# Patient Record
Sex: Female | Born: 1960 | Race: White | Hispanic: No | Marital: Married | Smoking: Never smoker
Health system: Southern US, Community
[De-identification: ages and names within clinical notes are randomized; demographics above are authoritative.]

## PROBLEM LIST (undated history)

## (undated) DIAGNOSIS — R61 Generalized hyperhidrosis: Secondary | ICD-10-CM

## (undated) DIAGNOSIS — K219 Gastro-esophageal reflux disease without esophagitis: Secondary | ICD-10-CM

## (undated) DIAGNOSIS — M791 Myalgia, unspecified site: Secondary | ICD-10-CM

## (undated) DIAGNOSIS — Z87442 Personal history of urinary calculi: Secondary | ICD-10-CM

## (undated) DIAGNOSIS — M5030 Other cervical disc degeneration, unspecified cervical region: Secondary | ICD-10-CM

## (undated) HISTORY — PX: COLONOSCOPY: SHX174

## (undated) HISTORY — DX: Myalgia, unspecified site: M79.10

## (undated) HISTORY — DX: Generalized hyperhidrosis: R61

---

## 2000-12-12 HISTORY — PX: BACK SURGERY: SHX140

## 2004-04-30 ENCOUNTER — Emergency Department: Payer: Self-pay | Admitting: Emergency Medicine

## 2004-05-03 ENCOUNTER — Ambulatory Visit: Payer: Self-pay | Admitting: Urology

## 2004-06-03 HISTORY — PX: OTHER SURGICAL HISTORY: SHX169

## 2004-10-16 ENCOUNTER — Ambulatory Visit: Payer: Self-pay

## 2005-06-03 HISTORY — PX: ELBOW SURGERY: SHX618

## 2006-01-29 ENCOUNTER — Ambulatory Visit: Payer: Self-pay

## 2006-06-20 ENCOUNTER — Ambulatory Visit: Payer: Self-pay | Admitting: Unknown Physician Specialty

## 2006-07-29 ENCOUNTER — Encounter: Payer: Self-pay | Admitting: Unknown Physician Specialty

## 2006-08-02 ENCOUNTER — Encounter: Payer: Self-pay | Admitting: Unknown Physician Specialty

## 2007-02-11 ENCOUNTER — Ambulatory Visit: Payer: Self-pay

## 2008-05-03 ENCOUNTER — Ambulatory Visit: Payer: Self-pay

## 2009-05-04 ENCOUNTER — Ambulatory Visit: Payer: Self-pay

## 2010-03-17 ENCOUNTER — Inpatient Hospital Stay: Payer: Self-pay | Admitting: General Practice

## 2010-03-17 HISTORY — PX: ANKLE SURGERY: SHX546

## 2010-07-18 ENCOUNTER — Ambulatory Visit: Payer: Self-pay

## 2011-07-31 ENCOUNTER — Ambulatory Visit: Payer: Self-pay

## 2012-09-22 ENCOUNTER — Ambulatory Visit: Payer: Self-pay

## 2013-03-18 ENCOUNTER — Ambulatory Visit (INDEPENDENT_AMBULATORY_CARE_PROVIDER_SITE_OTHER): Payer: BC Managed Care – PPO | Admitting: Podiatry

## 2013-03-18 ENCOUNTER — Ambulatory Visit (INDEPENDENT_AMBULATORY_CARE_PROVIDER_SITE_OTHER): Payer: BC Managed Care – PPO

## 2013-03-18 ENCOUNTER — Encounter: Payer: Self-pay | Admitting: Podiatry

## 2013-03-18 ENCOUNTER — Encounter: Payer: Self-pay | Admitting: *Deleted

## 2013-03-18 VITALS — BP 139/82 | HR 79 | Resp 16 | Ht 69.0 in | Wt 245.0 lb

## 2013-03-18 DIAGNOSIS — M79609 Pain in unspecified limb: Secondary | ICD-10-CM

## 2013-03-18 DIAGNOSIS — M79671 Pain in right foot: Secondary | ICD-10-CM

## 2013-03-18 DIAGNOSIS — M722 Plantar fascial fibromatosis: Secondary | ICD-10-CM

## 2013-03-18 NOTE — Patient Instructions (Signed)
Plantar Fasciitis (Heel Spur Syndrome) with Rehab The plantar fascia is a fibrous, ligament-like, soft-tissue structure that spans the bottom of the foot. Plantar fasciitis is a condition that causes pain in the foot due to inflammation of the tissue. SYMPTOMS   Pain and tenderness on the underneath side of the foot.  Pain that worsens with standing or walking. CAUSES  Plantar fasciitis is caused by irritation and injury to the plantar fascia on the underneath side of the foot. Common mechanisms of injury include:  Direct trauma to bottom of the foot.  Damage to a small nerve that runs under the foot where the main fascia attaches to the heel bone.  Stress placed on the plantar fascia due to bone spurs. RISK INCREASES WITH:   Activities that place stress on the plantar fascia (running, jumping, pivoting, or cutting).  Poor strength and flexibility.  Improperly fitted shoes.  Tight calf muscles.  Flat feet.  Failure to warm-up properly before activity.  Obesity. PREVENTION  Warm up and stretch properly before activity.  Allow for adequate recovery between workouts.  Maintain physical fitness:  Strength, flexibility, and endurance.  Cardiovascular fitness.  Maintain a health body weight.  Avoid stress on the plantar fascia.  Wear properly fitted shoes, including arch supports for individuals who have flat feet. PROGNOSIS  If treated properly, then the symptoms of plantar fasciitis usually resolve without surgery. However, occasionally surgery is necessary. RELATED COMPLICATIONS   Recurrent symptoms that may result in a chronic condition.  Problems of the lower back that are caused by compensating for the injury, such as limping.  Pain or weakness of the foot during push-off following surgery.  Chronic inflammation, scarring, and partial or complete fascia tear, occurring more often from repeated injections. TREATMENT  Treatment initially involves the use of  ice and medication to help reduce pain and inflammation. The use of strengthening and stretching exercises may help reduce pain with activity, especially stretches of the Achilles tendon. These exercises may be performed at home or with a therapist. Your caregiver may recommend that you use heel cups of arch supports to help reduce stress on the plantar fascia. Occasionally, corticosteroid injections are given to reduce inflammation. If symptoms persist for greater than 6 months despite non-surgical (conservative), then surgery may be recommended.  MEDICATION   If pain medication is necessary, then nonsteroidal anti-inflammatory medications, such as aspirin and ibuprofen, or other minor pain relievers, such as acetaminophen, are often recommended.  Do not take pain medication within 7 days before surgery.  Prescription pain relievers may be given if deemed necessary by your caregiver. Use only as directed and only as much as you need.  Corticosteroid injections may be given by your caregiver. These injections should be reserved for the most serious cases, because they may only be given a certain number of times. HEAT AND COLD  Cold treatment (icing) relieves pain and reduces inflammation. Cold treatment should be applied for 10 to 15 minutes every 2 to 3 hours for inflammation and pain and immediately after any activity that aggravates your symptoms. Use ice packs or massage the area with a piece of ice (ice massage).  Heat treatment may be used prior to performing the stretching and strengthening activities prescribed by your caregiver, physical therapist, or athletic trainer. Use a heat pack or soak the injury in warm water. SEEK IMMEDIATE MEDICAL CARE IF:  Treatment seems to offer no benefit, or the condition worsens.  Any medications produce adverse side effects. EXERCISES RANGE   OF MOTION (ROM) AND STRETCHING EXERCISES - Plantar Fasciitis (Heel Spur Syndrome) These exercises may help you  when beginning to rehabilitate your injury. Your symptoms may resolve with or without further involvement from your physician, physical therapist or athletic trainer. While completing these exercises, remember:   Restoring tissue flexibility helps normal motion to return to the joints. This allows healthier, less painful movement and activity.  An effective stretch should be held for at least 30 seconds.  A stretch should never be painful. You should only feel a gentle lengthening or release in the stretched tissue. RANGE OF MOTION - Toe Extension, Flexion  Sit with your right / left leg crossed over your opposite knee.  Grasp your toes and gently pull them back toward the top of your foot. You should feel a stretch on the bottom of your toes and/or foot.  Hold this stretch for __________ seconds.  Now, gently pull your toes toward the bottom of your foot. You should feel a stretch on the top of your toes and or foot.  Hold this stretch for __________ seconds. Repeat __________ times. Complete this stretch __________ times per day.  RANGE OF MOTION - Ankle Dorsiflexion, Active Assisted  Remove shoes and sit on a chair that is preferably not on a carpeted surface.  Place right / left foot under knee. Extend your opposite leg for support.  Keeping your heel down, slide your right / left foot back toward the chair until you feel a stretch at your ankle or calf. If you do not feel a stretch, slide your bottom forward to the edge of the chair, while still keeping your heel down.  Hold this stretch for __________ seconds. Repeat __________ times. Complete this stretch __________ times per day.  STRETCH  Gastroc, Standing  Place hands on wall.  Extend right / left leg, keeping the front knee somewhat bent.  Slightly point your toes inward on your back foot.  Keeping your right / left heel on the floor and your knee straight, shift your weight toward the wall, not allowing your back to  arch.  You should feel a gentle stretch in the right / left calf. Hold this position for __________ seconds. Repeat __________ times. Complete this stretch __________ times per day. STRETCH  Soleus, Standing  Place hands on wall.  Extend right / left leg, keeping the other knee somewhat bent.  Slightly point your toes inward on your back foot.  Keep your right / left heel on the floor, bend your back knee, and slightly shift your weight over the back leg so that you feel a gentle stretch deep in your back calf.  Hold this position for __________ seconds. Repeat __________ times. Complete this stretch __________ times per day. STRETCH  Gastrocsoleus, Standing  Note: This exercise can place a lot of stress on your foot and ankle. Please complete this exercise only if specifically instructed by your caregiver.   Place the ball of your right / left foot on a step, keeping your other foot firmly on the same step.  Hold on to the wall or a rail for balance.  Slowly lift your other foot, allowing your body weight to press your heel down over the edge of the step.  You should feel a stretch in your right / left calf.  Hold this position for __________ seconds.  Repeat this exercise with a slight bend in your right / left knee. Repeat __________ times. Complete this stretch __________ times per day.    STRENGTHENING EXERCISES - Plantar Fasciitis (Heel Spur Syndrome)  These exercises may help you when beginning to rehabilitate your injury. They may resolve your symptoms with or without further involvement from your physician, physical therapist or athletic trainer. While completing these exercises, remember:   Muscles can gain both the endurance and the strength needed for everyday activities through controlled exercises.  Complete these exercises as instructed by your physician, physical therapist or athletic trainer. Progress the resistance and repetitions only as guided. STRENGTH - Towel  Curls  Sit in a chair positioned on a non-carpeted surface.  Place your foot on a towel, keeping your heel on the floor.  Pull the towel toward your heel by only curling your toes. Keep your heel on the floor.  If instructed by your physician, physical therapist or athletic trainer, add ____________________ at the end of the towel. Repeat __________ times. Complete this exercise __________ times per day. STRENGTH - Ankle Inversion  Secure one end of a rubber exercise band/tubing to a fixed object (table, pole). Loop the other end around your foot just before your toes.  Place your fists between your knees. This will focus your strengthening at your ankle.  Slowly, pull your big toe up and in, making sure the band/tubing is positioned to resist the entire motion.  Hold this position for __________ seconds.  Have your muscles resist the band/tubing as it slowly pulls your foot back to the starting position. Repeat __________ times. Complete this exercises __________ times per day.  Document Released: 05/20/2005 Document Revised: 08/12/2011 Document Reviewed: 09/01/2008 [employer] Patient Information 2014 [employer]. Plantar Fasciitis Plantar fasciitis is a common condition that causes foot pain. It is soreness (inflammation) of the band of tough fibrous tissue on the bottom of the foot that runs from the heel bone (calcaneus) to the ball of the foot. The cause of this soreness may be from excessive standing, poor fitting shoes, running on hard surfaces, being overweight, having an abnormal walk, or overuse (this is common in runners) of the painful foot or feet. It is also common in aerobic exercise dancers and ballet dancers. SYMPTOMS  Most people with plantar fasciitis complain of:  Severe pain in the morning on the bottom of their foot especially when taking the first steps out of bed. This pain recedes after a few minutes of walking.  Severe pain is experienced also during walking  following a long period of inactivity.  Pain is worse when walking barefoot or up stairs DIAGNOSIS   Your caregiver will diagnose this condition by examining and feeling your foot.  Special tests such as X-rays of your foot, are usually not needed. PREVENTION   Consult a sports medicine professional before beginning a new exercise program.  Walking programs offer a good workout. With walking there is a lower chance of overuse injuries common to runners. There is less impact and less jarring of the joints.  Begin all new exercise programs slowly. If problems or pain develop, decrease the amount of time or distance until you are at a comfortable level.  Wear good shoes and replace them regularly.  Stretch your foot and the heel cords at the back of the ankle (Achilles tendon) both before and after exercise.  Run or exercise on even surfaces that are not hard. For example, asphalt is better than pavement.  Do not run barefoot on hard surfaces.  If using a treadmill, vary the incline.  Do not continue to workout if you have foot or joint   problems. Seek professional help if they do not improve. HOME CARE INSTRUCTIONS   Avoid activities that cause you pain until you recover.  Use ice or cold packs on the problem or painful areas after working out.  Only take over-the-counter or prescription medicines for pain, discomfort, or fever as directed by your caregiver.  Soft shoe inserts or athletic shoes with air or gel sole cushions may be helpful.  If problems continue or become more severe, consult a sports medicine caregiver or your own health care provider. Cortisone is a potent anti-inflammatory medication that may be injected into the painful area. You can discuss this treatment with your caregiver. MAKE SURE YOU:   Understand these instructions.  Will watch your condition.  Will get help right away if you are not doing well or get worse. Document Released: 02/12/2001 Document  Revised: 08/12/2011 Document Reviewed: 04/13/2008 [employer] Patient Information 2014 [employer].  

## 2013-03-18 NOTE — Progress Notes (Signed)
Tammy Koch presents today with a chief complaint of right heel pain times last few weeks. She's tried to use her Cam Walker. She states that the tibialis anterior tendinitis has resolved. She stated that she was doing very well until a plantar fasciitis flared up.  Objective: Vital signs are stable she is alert and oriented x3 I reviewed her past medical history medications and allergies. Pulses remain palpable to the right lower extremity. No calf pain. Mild to moderate edema to the bilateral legs. She has pain on palpation medial calcaneal tubercle of the right heel. Radiographic evaluation does demonstrate soft tissue increase in density at the plantar fascial calcaneal insertion site of the right heel.  Assessment: Plantar fasciitis right.  Plan: Discussed etiology pathology conservative versus surgical therapies. We discussed appropriate shoe gear stretching exercises and ice therapy. Handouts were given on these. I injected the right heel with 20 mg of Kenalog and local anesthetic. Plantar fascial strapping was applied. Her Sterapred Dosepak to be followed by Mobic. We'll followup with her in one month.

## 2013-03-22 ENCOUNTER — Telehealth: Payer: Self-pay | Admitting: *Deleted

## 2013-03-22 ENCOUNTER — Other Ambulatory Visit: Payer: Self-pay | Admitting: Podiatry

## 2013-03-22 MED ORDER — METHYLPREDNISOLONE (PAK) 4 MG PO TABS: ORAL_TABLET | ORAL | Status: DC

## 2013-03-22 NOTE — Telephone Encounter (Signed)
I have prescribed it and it should be waiting on her at the pharmacy with her mobic.

## 2013-03-22 NOTE — Telephone Encounter (Signed)
Patient stated that she was here Thursday and saw Dr Al Corpus, she stated that you would be prescribing a prednisone for her .

## 2013-03-26 ENCOUNTER — Telehealth: Payer: Self-pay | Admitting: *Deleted

## 2013-03-26 NOTE — Telephone Encounter (Signed)
Message copied by Bing Ree on Fri Mar 26, 2013  1:57 PM ------      Message from: Kathreen Cornfield      Created: Fri Mar 26, 2013  1:43 PM      Regarding: PHONE CALL      Contact: [phone]       CALLED AND SAID SHE IS CURRENTLY TAKING PREDNISONE AND WOULD LIKE TO KNOW IF SHE IS STILL SUPPOSE TO TAKE THE MOBIC ------

## 2013-04-14 ENCOUNTER — Ambulatory Visit: Payer: BC Managed Care – PPO | Admitting: Podiatry

## 2013-05-12 ENCOUNTER — Ambulatory Visit: Payer: BC Managed Care – PPO | Admitting: Podiatry

## 2013-11-01 ENCOUNTER — Ambulatory Visit: Payer: Self-pay

## 2013-11-01 DIAGNOSIS — I10 Essential (primary) hypertension: Secondary | ICD-10-CM | POA: Insufficient documentation

## 2014-01-17 ENCOUNTER — Ambulatory Visit: Payer: Self-pay | Admitting: Gastroenterology

## 2014-01-19 LAB — PATHOLOGY REPORT

## 2014-04-01 DIAGNOSIS — E538 Deficiency of other specified B group vitamins: Secondary | ICD-10-CM | POA: Insufficient documentation

## 2014-11-14 ENCOUNTER — Ambulatory Visit (INDEPENDENT_AMBULATORY_CARE_PROVIDER_SITE_OTHER): Payer: BLUE CROSS/BLUE SHIELD | Admitting: Podiatry

## 2014-11-14 ENCOUNTER — Ambulatory Visit (INDEPENDENT_AMBULATORY_CARE_PROVIDER_SITE_OTHER): Payer: BLUE CROSS/BLUE SHIELD

## 2014-11-14 ENCOUNTER — Encounter: Payer: Self-pay | Admitting: Podiatry

## 2014-11-14 VITALS — BP 116/68 | HR 79 | Resp 16

## 2014-11-14 DIAGNOSIS — S99922A Unspecified injury of left foot, initial encounter: Secondary | ICD-10-CM

## 2014-11-14 DIAGNOSIS — S92302A Fracture of unspecified metatarsal bone(s), left foot, initial encounter for closed fracture: Secondary | ICD-10-CM

## 2014-11-14 NOTE — Progress Notes (Signed)
She presents in a chief complaint of the painful left foot since she twisted her weekend while she was wearing her flip-flops instead a stair wrong. No changes in her past medical history medications.  Objective: Pulses are palpable bilateral. Moderate edema left foot with ecchymosis overlying the lesser metatarsophalangeal joints and the fifth metatarsal of the left foot. Radiographs confirm a fracture the base of the fifth metatarsal left foot. Moderately displaced laterally minimally displaced dorsally  Assessment: Fracture fifth metatarsal base left.  Plan: Discussed etiology pathology conservative versus surgical therapies. Place her in a cam walker which she already has at home and will follow up with her in 3 weeks rate and be taken at that time. If not improved surgical intervention will be necessary.

## 2014-12-07 ENCOUNTER — Encounter: Payer: Self-pay | Admitting: Podiatry

## 2014-12-07 ENCOUNTER — Ambulatory Visit (INDEPENDENT_AMBULATORY_CARE_PROVIDER_SITE_OTHER): Payer: BLUE CROSS/BLUE SHIELD | Admitting: Podiatry

## 2014-12-07 ENCOUNTER — Ambulatory Visit (INDEPENDENT_AMBULATORY_CARE_PROVIDER_SITE_OTHER): Payer: BLUE CROSS/BLUE SHIELD

## 2014-12-07 ENCOUNTER — Ambulatory Visit
Admission: RE | Admit: 2014-12-07 | Discharge: 2014-12-07 | Disposition: A | Discharge status: Home / Self Care | Payer: BLUE CROSS/BLUE SHIELD | Source: Ambulatory Visit | Attending: Obstetrics and Gynecology | Admitting: Obstetrics and Gynecology

## 2014-12-07 ENCOUNTER — Other Ambulatory Visit: Payer: Self-pay | Admitting: Obstetrics and Gynecology

## 2014-12-07 VITALS — BP 124/79 | HR 99 | Resp 18

## 2014-12-07 DIAGNOSIS — Z1231 Encounter for screening mammogram for malignant neoplasm of breast: Secondary | ICD-10-CM

## 2014-12-07 DIAGNOSIS — S92302D Fracture of unspecified metatarsal bone(s), left foot, subsequent encounter for fracture with routine healing: Secondary | ICD-10-CM

## 2014-12-07 NOTE — Progress Notes (Signed)
She presents today for follow-up of her fifth metatarsal fracture left foot. She states that it feels much better. 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  Objective: Vital signs are stable she is alert and oriented 3. Pulses are palpable. She has mild Tinel's on palpation fifth metatarsal base of the left foot without ecchymosis and without edema. Radiographs do demonstrate diastases fifth metatarsal base fracture.  Assessment fifth metatarsal base fracture slowly healing left.  Plan: Continue the use of the Cam Walker anytime that she is not at work. Follow up with her for another set of x-rays in 4-6 weeks.

## 2015-01-18 ENCOUNTER — Ambulatory Visit: Payer: BLUE CROSS/BLUE SHIELD | Admitting: Podiatry

## 2015-01-23 ENCOUNTER — Other Ambulatory Visit
Admission: RE | Admit: 2015-01-23 | Discharge: 2015-01-23 | Disposition: A | Discharge status: Home / Self Care | Payer: BLUE CROSS/BLUE SHIELD | Source: Ambulatory Visit | Attending: Internal Medicine | Admitting: Internal Medicine

## 2015-01-23 DIAGNOSIS — R079 Chest pain, unspecified: Secondary | ICD-10-CM | POA: Insufficient documentation

## 2015-01-23 LAB — TROPONIN I

## 2015-02-19 ENCOUNTER — Encounter: Payer: Self-pay | Admitting: Emergency Medicine

## 2015-02-19 ENCOUNTER — Ambulatory Visit
Admission: EM | Admit: 2015-02-19 | Discharge: 2015-02-19 | Disposition: A | Discharge status: Home / Self Care | Payer: BLUE CROSS/BLUE SHIELD | Attending: Family Medicine | Admitting: Family Medicine

## 2015-02-19 DIAGNOSIS — B998 Other infectious disease: Secondary | ICD-10-CM | POA: Diagnosis not present

## 2015-02-19 DIAGNOSIS — L089 Local infection of the skin and subcutaneous tissue, unspecified: Secondary | ICD-10-CM

## 2015-02-19 MED ORDER — AMOXICILLIN-POT CLAVULANATE 875-125 MG PO TABS: 1.0000 | ORAL_TABLET | Freq: Two times a day (BID) | ORAL | Status: DC

## 2015-02-19 NOTE — ED Notes (Signed)
Patient presents here with c/o rt side swelling to the face , denies any fever, the swelling started as a small on 1 months ago and got worse

## 2015-02-19 NOTE — Discharge Instructions (Signed)
Facial Infection °You have an infection of your face. This requires special attention to help prevent serious problems. Infections in facial wounds can cause poor healing and scars. They can also spread to deeper tissues, especially around the eye. Wound and dental infections can lead to sinusitis, infection of the eye socket, and even meningitis. Permanent damage to the skin, eye, and nervous system may result if facial infections are not treated properly. With severe infections, hospital care for IV antibiotic injections may be needed if they don't respond to oral antibiotics. °Antibiotics must be taken for the full course to insure the infection is eliminated. If the infection came from a bad tooth, it may have to be extracted when the infection is under control. Warm compresses may be applied to reduce skin irritation and remove drainage. °You might need a tetanus shot now if: °· You cannot remember when your last tetanus shot was. °· You have never had a tetanus shot. °· The object that caused your wound was dirty. °If you need a tetanus shot, and you decide not to get one, there is a rare chance of getting tetanus. Sickness from tetanus can be serious. If you got a tetanus shot, your arm may swell, get red and warm to the touch at the shot site. This is common and not a problem. °SEEK IMMEDIATE MEDICAL CARE IF:  °· You have increased swelling, redness, or trouble breathing. °· You have a severe headache, dizziness, nausea, or vomiting. °· You develop problems with your eyesight. °· You have a fever. °Document Released: 06/27/2004 Document Revised: 08/12/2011 Document Reviewed: 05/20/2005 °Exit[employer]® Patient Information ©2015 [employer]. This information is not intended to replace advice given to you by your health care provider. Make sure you discuss any questions you have with your health care provider. ° °

## 2015-02-19 NOTE — ED Provider Notes (Signed)
CSN: [id number]     Arrival date & time 02/19/15  1122 History   First MD Initiated Contact with Patient 02/19/15 1343     Chief Complaint  Patient presents with  . Facial Swelling   (Consider location/radiation/quality/duration/timing/severity/associated sxs/prior Treatment) HPI   This a 54 year old female who presents today with a sudden onset of nose and facial swelling on the right side. She states that she noticed it last last night and awoke this morning with more swollen. SHe has tenderness in the area and has been having this large amount of sinus drainage that side since the swelling began. She also relates that one month ago she had a fever blister in that area after she returned from the beach and been in the sun a great deal of time. This went away after a period of time and she began to have some pain on the inner side of her right nare. Denies any fever or chills. Prior to the onset of the fever blister she's had a feeling of a knot on the side of her nose beside her nose on her cheek about the size of a pea  which now extends from her  nasal fold to over her lip. She has not had any dental problems;recently had a simple filling preceding this. Denies any facial weakness or difficulty closing her eye; loss of taste; today does have a flattening of the nasolabial fold from the swelling.    Past Medical History  Diagnosis Date  . Muscle pain   . Night sweats    Past Surgical History  Procedure Laterality Date  . Back surgery  7.12.2002  . Ankle surgery  10.15.2011  . Elbow surgery Right 2007  . Kidney stones  2006    LITHOTRIPSY   Family History  Problem Relation Age of Onset  . Ovarian cancer Mother 44   Social History  Substance Use Topics  . Smoking status: Never Smoker   . Smokeless tobacco: Never Used  . Alcohol Use: No   OB History    No data available     Review of Systems  Constitutional: Positive for fatigue. Negative for fever, chills and unexpected  weight change.  HENT: Positive for ear pain, facial swelling and mouth sores. Negative for hearing loss and sore throat.   Eyes: Negative for photophobia, pain, discharge, redness and visual disturbance.  Neurological: Positive for facial asymmetry and headaches.  All other systems reviewed and are negative.   Allergies  Morphine and Morphine and related  Home Medications   Prior to Admission medications   Medication Sig Start Date End Date Taking? Authorizing Provider  acetaminophen (TYLENOL) 500 MG tablet Take 500 mg by mouth every 6 (six) hours as needed.    Historical Provider, MD  amoxicillin-clavulanate (AUGMENTIN) 875-125 MG per tablet Take 1 tablet by mouth every 12 (twelve) hours. 02/19/15   Lorin Picket, PA-C   Meds Ordered and Administered this Visit  Medications - No data to display  BP 171/83 mmHg  Pulse 78  Temp(Src) 98.9 F (37.2 C) (Oral)  Resp 20  Ht 5\' 9"  (1.753 m)  Wt 240 lb (108.863 kg)  BMI 35.43 kg/m2  SpO2 100% No data found.   Physical Exam  Constitutional: She is oriented to person, place, and time. She appears well-developed and well-nourished. No distress.  HENT:  Head: Atraumatic.  Right Ear: External ear normal.  Left Ear: External ear normal.  Mouth/Throat: Oropharynx is clear and moist.  Examination of the  face swelling over the malar region affecting the nose and upper right lip. She does have a firm nodule near the nasal/ facial fold over into the malar region for distance of approximately 1 1/2 cm. This is tender to the touch. She can fully close her eyes ;able to wrinkle  her forehead' ;blow out  her cheeks, stick her tongue out straight. In the auricle of the right ear she has a perception of a "blackhead" but the examination failed to see any abnormality. TMs are normal bilaterally. Dentition is in good repair.   Eyes: EOM are normal. Pupils are equal, round, and reactive to light. Right eye exhibits no discharge. Left eye exhibits no  discharge.  Neck: Normal range of motion. Neck supple. No thyromegaly present.  Musculoskeletal: Normal range of motion. She exhibits no edema or tenderness.  Lymphadenopathy:    She has no cervical adenopathy.  Neurological: She is alert and oriented to person, place, and time. No cranial nerve deficit. Coordination normal.  Skin: Skin is warm and dry. She is not diaphoretic.  Psychiatric: She has a normal mood and affect. Her behavior is normal. Judgment and thought content normal.  Nursing note and vitals reviewed.   ED Course  Procedures (including critical care time)  Labs Review Labs Reviewed - No data to display  Imaging Review No results found.   Visual Acuity Review  Right Eye Distance:   Left Eye Distance:   Bilateral Distance:    Right Eye Near:   Left Eye Near:    Bilateral Near:         MDM   1. Facial infection    Discharge Medication List as of 02/19/2015  2:15 PM    START taking these medications   Details  amoxicillin-clavulanate (AUGMENTIN) 875-125 MG per tablet Take 1 tablet by mouth every 12 (twelve) hours., Starting 02/19/2015, Until Discontinued, Normal      Plan: 1. Diagnosis reviewed with patient 2. rx as per orders; risks, benefits, potential side effects reviewed with patient 3. Recommend supportive treatment with warm compresses 3 times a day; instructions were given to the patient. She may require ENT examination for possible abscess drainage if her symptoms have not improved. I have given her the name of Dr. Myna Hidalgo to contact in 48 hours if she is not improving. She is to go the emergency room if she has any increase in fever or pain or increased swelling. 4. F/u as above.  Lorin Picket, PA-C 02/19/15 [address] PA-C 02/19/15 1659

## 2015-12-13 ENCOUNTER — Other Ambulatory Visit: Payer: Self-pay | Admitting: Obstetrics and Gynecology

## 2015-12-13 DIAGNOSIS — Z1231 Encounter for screening mammogram for malignant neoplasm of breast: Secondary | ICD-10-CM

## 2016-01-01 ENCOUNTER — Other Ambulatory Visit: Payer: Self-pay | Admitting: Obstetrics and Gynecology

## 2016-01-01 ENCOUNTER — Ambulatory Visit
Admission: RE | Admit: 2016-01-01 | Discharge: 2016-01-01 | Disposition: A | Discharge status: Home / Self Care | Payer: BLUE CROSS/BLUE SHIELD | Source: Ambulatory Visit | Attending: Obstetrics and Gynecology | Admitting: Obstetrics and Gynecology

## 2016-01-01 DIAGNOSIS — Z1231 Encounter for screening mammogram for malignant neoplasm of breast: Secondary | ICD-10-CM | POA: Diagnosis present

## 2016-02-26 ENCOUNTER — Other Ambulatory Visit
Admission: RE | Admit: 2016-02-26 | Discharge: 2016-02-26 | Disposition: A | Discharge status: Home / Self Care | Payer: BLUE CROSS/BLUE SHIELD | Source: Ambulatory Visit | Attending: Internal Medicine | Admitting: Internal Medicine

## 2016-02-26 DIAGNOSIS — R1013 Epigastric pain: Secondary | ICD-10-CM | POA: Diagnosis present

## 2016-02-26 DIAGNOSIS — R079 Chest pain, unspecified: Secondary | ICD-10-CM | POA: Insufficient documentation

## 2016-02-26 LAB — TROPONIN I: Troponin I: 0.03 ng/mL (ref <0.03)

## 2016-02-27 ENCOUNTER — Other Ambulatory Visit: Payer: Self-pay | Admitting: Internal Medicine

## 2016-02-27 DIAGNOSIS — R1013 Epigastric pain: Secondary | ICD-10-CM

## 2016-03-01 ENCOUNTER — Other Ambulatory Visit
Admission: RE | Admit: 2016-03-01 | Discharge: 2016-03-01 | Disposition: A | Discharge status: Home / Self Care | Payer: BLUE CROSS/BLUE SHIELD | Source: Ambulatory Visit | Attending: Internal Medicine | Admitting: Internal Medicine

## 2016-03-01 DIAGNOSIS — R748 Abnormal levels of other serum enzymes: Secondary | ICD-10-CM | POA: Diagnosis present

## 2016-03-01 LAB — TROPONIN I: Troponin I: <0.03 ng/mL (ref <0.03)

## 2016-03-04 ENCOUNTER — Ambulatory Visit
Admission: RE | Admit: 2016-03-04 | Discharge: 2016-03-04 | Disposition: A | Discharge status: Home / Self Care | Payer: BLUE CROSS/BLUE SHIELD | Source: Ambulatory Visit | Attending: Internal Medicine | Admitting: Internal Medicine

## 2016-03-04 DIAGNOSIS — N281 Cyst of kidney, acquired: Secondary | ICD-10-CM | POA: Diagnosis not present

## 2016-03-04 DIAGNOSIS — K8020 Calculus of gallbladder without cholecystitis without obstruction: Secondary | ICD-10-CM | POA: Insufficient documentation

## 2016-03-04 DIAGNOSIS — R1013 Epigastric pain: Secondary | ICD-10-CM | POA: Insufficient documentation

## 2016-03-06 DIAGNOSIS — K8020 Calculus of gallbladder without cholecystitis without obstruction: Secondary | ICD-10-CM | POA: Insufficient documentation

## 2016-03-28 ENCOUNTER — Encounter
Admission: RE | Admit: 2016-03-28 | Discharge: 2016-03-28 | Disposition: A | Discharge status: Home / Self Care | Payer: BLUE CROSS/BLUE SHIELD | Source: Ambulatory Visit | Attending: Surgery | Admitting: Surgery

## 2016-03-28 HISTORY — DX: Other cervical disc degeneration, unspecified cervical region: M50.30

## 2016-03-28 HISTORY — DX: Personal history of urinary calculi: Z87.442

## 2016-03-28 HISTORY — DX: Gastro-esophageal reflux disease without esophagitis: K21.9

## 2016-03-28 NOTE — Pre-Procedure Instructions (Signed)
ECG 12-lead9/25/2017 [place] Component Name Value Ref Range  Vent Rate (bpm) 83   PR Interval (msec) 148   QRS Interval (msec) 74   QT Interval (msec) 342   QTc (msec) 401   Result Narrative  Normal sinus rhythm Normal ECG When compared with ECG of 23-Jan-2015 14:03, T wave amplitude has increased in Anterior leads I reviewed and concur with this report. Electronically signed [id number] MD, BERT [phone]) on 03/20/2016 8:36:31 AM  Status Results Details    Office Visit on 02/26/2016 [place]")' href="[email or web address] Summary

## 2016-03-28 NOTE — Patient Instructions (Signed)
  Your procedure is scheduled on: 04-05-16 Report to Same Day Surgery 2nd floor medical mall To find out your arrival time please call [phone] between 1PM - 3PM on 04-04-16  Remember: Instructions that are not followed completely may result in serious medical risk, up to and including death, or upon the discretion of your surgeon and anesthesiologist your surgery may need to be rescheduled.    _x___ 1. Do not eat food or drink liquids after midnight. No gum chewing or hard candies.     __x__ 2. No Alcohol for 24 hours before or after surgery.   __x__3. No Smoking for 24 prior to surgery.   ____  4. Bring all medications with you on the day of surgery if instructed.    __x__ 5. Notify your doctor if there is any change in your medical condition     (cold, fever, infections).     Do not wear jewelry, make-up, hairpins, clips or nail polish.  Do not wear lotions, powders, or perfumes. You may wear deodorant.  Do not shave 48 hours prior to surgery. Men may shave face and neck.  Do not bring valuables to the hospital.    [facility] is not responsible for any belongings or valuables.               Contacts, dentures or bridgework may not be worn into surgery.  Leave your suitcase in the car. After surgery it may be brought to your room.  For patients admitted to the hospital, discharge time is determined by your treatment team.   Patients discharged the day of surgery will not be allowed to drive home.    Please read over the following fact sheets that you were given:   [facility] Preparing for Surgery and or MRSA Information   _x___ Take these medicines the morning of surgery with A SIP OF WATER:    1. OMEPRAZOLE  2. TAKE AN OMEPRAZOLE ON Thursday NIGHT BEFORE BED  3.  4.  5.  6.  ____Fleets enema or Magnesium Citrate as directed.   ____ Use CHG Soap or sage wipes as directed on instruction sheet   ____ Use inhalers on the day of surgery and bring to hospital day of  surgery  ____ Stop metformin 2 days prior to surgery    ____ Take 1/2 of usual insulin dose the night before surgery and none on the morning of surgery.   ____ Stop aspirin or coumadin, or plavix  x__ Stop Anti-inflammatories such as Advil, Aleve, Ibuprofen, Motrin, Naproxen,          Naprosyn, Goodies powders or aspirin products. Ok to take Tylenol.   ____ Stop supplements until after surgery.    ____ Bring C-Pap to the hospital.

## 2016-03-28 NOTE — Pre-Procedure Instructions (Signed)
Echocardiogram stress test9/06/2014 [place] Component Name Value Ref Range  LV Ejection Fraction (%) 55   Aortic Valve Regurgitation Grade trivial   Aortic Valve Stenosis Grade none   Mitral Valve Regurgitation Grade trivial   Mitral Valve Stenosis Grade none   Tricuspid Valve Regurgitation Grade trivial   Result Narrative  INTERNAL MEDICINE DEPARTMENT DANISHIA, ORAMAS [id number] [facility] Acct #: [id number]  [address], [place] [address] Date: 02/02/2015 04:07 PM  Adult Female Age: 55 yrs  ECHOCARDIOGRAM REPORT Outpatient STUDY:Stress EchoTAPE: KC::KCWI  ECHO:Yes DOPPLER:YesFILE:0000-000-000 MD1:KLEIN, BERT COLOR:YesCONTRAST:NoMACHINE:PhilipsHeight: 67 in RV BIOPSY:No 3D:No SOUND QLTY:Moderate Weight: 247 lb  MEDIUM:None  BSA: 2.2 m2 ___________________________________________________________________________________________  HISTORY:Hypertension REASON:Assess, LV function Indication:R07.9 Acute chest pain  ___________________________________________________________________________________________ STRESS ECHOCARDIOGRAPHY   Protocol:Treadmill (Bruce) Drugs:None Target Heart Rate:141 bpmMaximum Predicted Heart Rate: 166 bpm  +-------------------+-------------------------+-------------------------+------------+  Stage  Duration (mm:ss) Heart Rate (bpm)  BP  +-------------------+-------------------------+-------------------------+------------+ RESTING 86  144/81  +-------------------+-------------------------+-------------------------+------------+ EXERCISE  3:00 171  / +-------------------+-------------------------+-------------------------+------------+ RECOVERY  8:4493  158/85  +-------------------+-------------------------+-------------------------+------------+  Stress Duration:3:00 mm:ss Max Stress H.R.:171 bpmTarget Heart Rate Achieved: Yes Maximum workload of 4.60 METS was achieved during exercise   ___________________________________________________________________________________________ WALL SEGMENT CHANGES  RestStress Anterior Septum Basal:NormalHyperkinetic [id number]  Apical:NormalHyperkinetic  Anterior Wall Basal:NormalHyperkinetic [id number]  Apical:NormalHyperkinetic   Lateral Wall Basal:NormalHyperkinetic [id number]  Apical:NormalHyperkinetic   Posterior Wall Basal:NormalHyperkinetic [id number]  Inferior Wall Basal:NormalHyperkinetic [id number]  Apical:NormalHyperkinetic  Inferior Septum Basal:NormalHyperkinetic [id number]   Resting EF:>55% (Est.) Stress EF: >55% (Est.)   ___________________________________________________________________________________________ ADDITIONAL FINDINGS  Chest  Discomfort:None Arrhythmia:None Termination Reason:Fatigue  Adverse Effects:None  Complications:None   ___________________________________________________________________________________________ STRESS ECG RESULTS   ECG Results:Normal  ___________________________________________________________________________________________  ECHOCARDIOGRAPHIC DESCRIPTIONS  LEFT VENTRICLE Size:Normal  Contraction:Normal  LV Masses:No Masses  OM:[id number] Dias.FxClass:Normal  RIGHT VENTRICLE Size:Normal Free Wall:Normal  Contraction:Normal RV Masses:No mass  PERICARDIUM  Fluid:No effusion  _______________________________________________________________________________________  DOPPLER ECHO and OTHER SPECIAL PROCEDURES  Aortic:TRIVIAL AR No AS   Mitral:TRIVIAL MR No MS MV Inflow E Vel=nm*MV Annulus E'Vel=nm* E/E'Ratio=nm*  Tricuspid:TRIVIAL TR No TS  Pulmonary:TRIVIAL PR No PS   ___________________________________________________________________________________________  ECHOCARDIOGRAPHIC MEASUREMENTS 2D DIMENSIONS AORTA ValuesNormal RangeMAIN PAValuesNormal Range Annulus:nm* [2.1 - 2.5]PA Main:nm* [1.5 - 2.1] Aorta Sin:nm* [2.7 - 3.3] RIGHT VENTRICLE ST Junction:nm* [2.3 - 2.9]RV Base:nm* [ < 4.2] Asc.Aorta:nm* [2.3 - 3.1] RV Mid:nm* [ < 3.5]  LEFT VENTRICLERV Length:nm* [ < 8.6] LVIDd:nm* [3.9 - 5.3] INFERIOR VENA CAVA LVIDs:nm* Max. IVC:nm* [ <= 2.1]  FS:nm* [>  25]Min. IVC:nm* SWT:nm* [0.5 - 0.9] ------------------ PWT:nm* [0.5 - 0.9] nm* - not measured  LEFT ATRIUM LA Diam:nm* [2.7 - 3.8] LA A4C Area:nm* [ < 20] LA Volume:nm* [22 - [id number]  ___________________________________________________________________________________________ INTERPRETATION Normal Stress Echocardiogram NORMAL RIGHT VENTRICULAR SYSTOLIC FUNCTION TRIVIAL REGURGITATION NOTED (See above) NO VALVULAR STENOSIS NOTED   ___________________________________________________________________________________________  Electronically signed by: Rusty Aus, MD on 02/03/2015 05:26 PM Performed By: Scherrie November, RCS Ordering Physician: Ramonita Lab ___________________________________________________________________________________________  Status Results Details    Appointment on 02/02/2015 [place]")' [email or web address]/">Encounter Summary

## 2016-04-05 ENCOUNTER — Ambulatory Visit: Payer: BLUE CROSS/BLUE SHIELD | Admitting: Anesthesiology

## 2016-04-05 ENCOUNTER — Encounter
Admission: RE | Disposition: A | Discharge status: Home / Self Care | Payer: Self-pay | Source: Ambulatory Visit | Attending: Surgery

## 2016-04-05 ENCOUNTER — Ambulatory Visit
Admission: RE | Admit: 2016-04-05 | Discharge: 2016-04-05 | Disposition: A | Discharge status: Home / Self Care | Payer: BLUE CROSS/BLUE SHIELD | Source: Ambulatory Visit | Attending: Surgery | Admitting: Surgery

## 2016-04-05 ENCOUNTER — Ambulatory Visit: Payer: BLUE CROSS/BLUE SHIELD

## 2016-04-05 DIAGNOSIS — E669 Obesity, unspecified: Secondary | ICD-10-CM | POA: Diagnosis not present

## 2016-04-05 DIAGNOSIS — E538 Deficiency of other specified B group vitamins: Secondary | ICD-10-CM | POA: Diagnosis not present

## 2016-04-05 DIAGNOSIS — K8010 Calculus of gallbladder with chronic cholecystitis without obstruction: Secondary | ICD-10-CM | POA: Diagnosis not present

## 2016-04-05 DIAGNOSIS — Z6833 Body mass index (BMI) 33.0-33.9, adult: Secondary | ICD-10-CM | POA: Diagnosis not present

## 2016-04-05 DIAGNOSIS — Z8041 Family history of malignant neoplasm of ovary: Secondary | ICD-10-CM | POA: Insufficient documentation

## 2016-04-05 DIAGNOSIS — E785 Hyperlipidemia, unspecified: Secondary | ICD-10-CM | POA: Insufficient documentation

## 2016-04-05 DIAGNOSIS — M722 Plantar fascial fibromatosis: Secondary | ICD-10-CM | POA: Diagnosis not present

## 2016-04-05 DIAGNOSIS — Z885 Allergy status to narcotic agent status: Secondary | ICD-10-CM | POA: Insufficient documentation

## 2016-04-05 DIAGNOSIS — Z8249 Family history of ischemic heart disease and other diseases of the circulatory system: Secondary | ICD-10-CM | POA: Diagnosis not present

## 2016-04-05 DIAGNOSIS — M1990 Unspecified osteoarthritis, unspecified site: Secondary | ICD-10-CM | POA: Insufficient documentation

## 2016-04-05 DIAGNOSIS — I10 Essential (primary) hypertension: Secondary | ICD-10-CM | POA: Diagnosis not present

## 2016-04-05 DIAGNOSIS — Z419 Encounter for procedure for purposes other than remedying health state, unspecified: Secondary | ICD-10-CM

## 2016-04-05 HISTORY — PX: CHOLECYSTECTOMY: SHX55

## 2016-04-05 SURGERY — LAPAROSCOPIC CHOLECYSTECTOMY WITH INTRAOPERATIVE CHOLANGIOGRAM
Anesthesia: General | Site: Abdomen | Wound class: Clean

## 2016-04-05 MED ORDER — HYDROCODONE-ACETAMINOPHEN 5-325 MG PO TABS: 1.0000 | ORAL_TABLET | ORAL | 0 refills | Status: DC | PRN

## 2016-04-05 MED ORDER — DEXAMETHASONE SODIUM PHOSPHATE 10 MG/ML IJ SOLN
INTRAMUSCULAR | Status: DC | PRN
  Administered 2016-04-05: 5 mg via INTRAVENOUS

## 2016-04-05 MED ORDER — LACTATED RINGERS IV SOLN
INTRAVENOUS | Status: DC
  Administered 2016-04-05 (×2): via INTRAVENOUS

## 2016-04-05 MED ORDER — LIDOCAINE HCL (CARDIAC) 20 MG/ML IV SOLN
INTRAVENOUS | Status: DC | PRN
Start: 2016-04-05 — End: 2016-04-05
  Administered 2016-04-05: 100 mg via INTRAVENOUS

## 2016-04-05 MED ORDER — SCOPOLAMINE 1 MG/3DAYS TD PT72
1.0000 | MEDICATED_PATCH | TRANSDERMAL | Status: DC
  Administered 2016-04-05: 1.5 mg via TRANSDERMAL

## 2016-04-05 MED ORDER — PROMETHAZINE HCL 25 MG/ML IJ SOLN
INTRAMUSCULAR | Status: AC
  Administered 2016-04-05: 6.25 mg via INTRAVENOUS
  Filled 2016-04-05: qty 1

## 2016-04-05 MED ORDER — HEPARIN SODIUM (PORCINE) 5000 UNIT/ML IJ SOLN
INTRAMUSCULAR | Status: AC
  Filled 2016-04-05: qty 1

## 2016-04-05 MED ORDER — DEXMEDETOMIDINE HCL 200 MCG/2ML IV SOLN
INTRAVENOUS | Status: DC | PRN
  Administered 2016-04-05: 16 ug via INTRAVENOUS

## 2016-04-05 MED ORDER — ONDANSETRON HCL 4 MG/2ML IJ SOLN
4.0000 mg | Freq: Once | INTRAMUSCULAR | Status: AC | PRN
  Administered 2016-04-05: 4 mg via INTRAVENOUS

## 2016-04-05 MED ORDER — SCOPOLAMINE 1 MG/3DAYS TD PT72
MEDICATED_PATCH | TRANSDERMAL | Status: AC
  Administered 2016-04-05: 1.5 mg via TRANSDERMAL
  Filled 2016-04-05: qty 1

## 2016-04-05 MED ORDER — HYDROCODONE-ACETAMINOPHEN 5-325 MG PO TABS: 1.0000 | ORAL_TABLET | ORAL | Status: DC | PRN

## 2016-04-05 MED ORDER — FENTANYL CITRATE (PF) 100 MCG/2ML IJ SOLN
INTRAMUSCULAR | Status: AC
  Administered 2016-04-05: 25 ug via INTRAVENOUS
  Filled 2016-04-05: qty 2

## 2016-04-05 MED ORDER — SUGAMMADEX SODIUM 500 MG/5ML IV SOLN
INTRAVENOUS | Status: DC | PRN
  Administered 2016-04-05: 200 mg via INTRAVENOUS

## 2016-04-05 MED ORDER — IOTHALAMATE MEGLUMINE 60 % INJ SOLN
INTRAMUSCULAR | Status: DC | PRN
  Administered 2016-04-05: 8 mL

## 2016-04-05 MED ORDER — ROCURONIUM BROMIDE 100 MG/10ML IV SOLN
INTRAVENOUS | Status: DC | PRN
  Administered 2016-04-05 (×2): 10 mg via INTRAVENOUS
  Administered 2016-04-05: 5 mg via INTRAVENOUS
  Administered 2016-04-05: 25 mg via INTRAVENOUS

## 2016-04-05 MED ORDER — HEPARIN SODIUM (PORCINE) 1000 UNIT/ML IJ SOLN
INTRAMUSCULAR | Status: DC | PRN
  Administered 2016-04-05: 200 mL via INTRAMUSCULAR

## 2016-04-05 MED ORDER — ONDANSETRON 4 MG PO TBDP: 4.0000 mg | ORAL_TABLET | Freq: Four times a day (QID) | ORAL | 0 refills | Status: DC | PRN

## 2016-04-05 MED ORDER — FENTANYL CITRATE (PF) 100 MCG/2ML IJ SOLN
25.0000 ug | INTRAMUSCULAR | Status: DC | PRN
  Administered 2016-04-05 (×4): 25 ug via INTRAVENOUS

## 2016-04-05 MED ORDER — ONDANSETRON HCL 4 MG/2ML IJ SOLN
INTRAMUSCULAR | Status: AC
  Filled 2016-04-05: qty 2

## 2016-04-05 MED ORDER — PROPOFOL 10 MG/ML IV BOLUS
INTRAVENOUS | Status: DC | PRN
  Administered 2016-04-05: 160 mg via INTRAVENOUS

## 2016-04-05 MED ORDER — FENTANYL CITRATE (PF) 100 MCG/2ML IJ SOLN
INTRAMUSCULAR | Status: DC | PRN
  Administered 2016-04-05 (×3): 50 ug via INTRAVENOUS

## 2016-04-05 MED ORDER — SUCCINYLCHOLINE CHLORIDE 20 MG/ML IJ SOLN
INTRAMUSCULAR | Status: DC | PRN
Start: 2016-04-05 — End: 2016-04-05
  Administered 2016-04-05: 100 mg via INTRAVENOUS

## 2016-04-05 MED ORDER — PROMETHAZINE HCL 25 MG/ML IJ SOLN
6.2500 mg | Freq: Once | INTRAMUSCULAR | Status: AC
  Administered 2016-04-05: 6.25 mg via INTRAVENOUS
  Filled 2016-04-05: qty 1

## 2016-04-05 MED ORDER — PHENYLEPHRINE HCL 10 MG/ML IJ SOLN
INTRAMUSCULAR | Status: DC | PRN
  Administered 2016-04-05: 100 ug via INTRAVENOUS

## 2016-04-05 SURGICAL SUPPLY — 49 items
APPLIER CLIP ROT 10 11.4 M/L (STAPLE) ×3
BENZOIN TINCTURE PRP APPL 2/3 (GAUZE/BANDAGES/DRESSINGS) ×3 IMPLANT
CANISTER SUCT 1200ML W/VALVE (MISCELLANEOUS) ×3 IMPLANT
CANNULA DILATOR 10 W/SLV (CANNULA) ×2 IMPLANT
CANNULA DILATOR 10MM W/SLV (CANNULA) ×1
CATH REDDICK CHOLANGI 4FR 50CM (CATHETERS) ×3 IMPLANT
CHLORAPREP W/TINT 26ML (MISCELLANEOUS) ×3 IMPLANT
CLIP APPLIE ROT 10 11.4 M/L (STAPLE) ×1 IMPLANT
CLOSURE WOUND 1/2 X4 (GAUZE/BANDAGES/DRESSINGS) ×1
DERMABOND ADVANCED (GAUZE/BANDAGES/DRESSINGS) ×2
DERMABOND ADVANCED .7 DNX12 (GAUZE/BANDAGES/DRESSINGS) ×1 IMPLANT
DRAPE SHEET LG 3/4 BI-LAMINATE (DRAPES) ×3 IMPLANT
ELECT REM PT RETURN 9FT ADLT (ELECTROSURGICAL) ×3
ELECTRODE REM PT RTRN 9FT ADLT (ELECTROSURGICAL) ×1 IMPLANT
GAUZE SPONGE 4X4 12PLY STRL (GAUZE/BANDAGES/DRESSINGS) ×3 IMPLANT
GLOVE BIO SURGEON STRL SZ7.5 (GLOVE) ×3 IMPLANT
GLOVE BIOGEL M 6.5 STRL (GLOVE) ×3 IMPLANT
GLOVE BIOGEL M 8.0 STRL (GLOVE) ×3 IMPLANT
GLOVE BIOGEL PI IND STRL 7.0 (GLOVE) ×1 IMPLANT
GLOVE BIOGEL PI IND STRL 8 (GLOVE) ×1 IMPLANT
GLOVE BIOGEL PI INDICATOR 7.0 (GLOVE) ×2
GLOVE BIOGEL PI INDICATOR 8 (GLOVE) ×2
GOWN STRL REUS W/ TWL LRG LVL3 (GOWN DISPOSABLE) ×4 IMPLANT
GOWN STRL REUS W/TWL LRG LVL3 (GOWN DISPOSABLE) ×8
IRRIGATION STRYKERFLOW (MISCELLANEOUS) ×1 IMPLANT
IRRIGATOR STRYKERFLOW (MISCELLANEOUS) ×3
IV NS 1000ML (IV SOLUTION) ×2
IV NS 1000ML BAXH (IV SOLUTION) ×1 IMPLANT
KIT RM TURNOVER STRD PROC AR (KITS) ×3 IMPLANT
LABEL OR SOLS (LABEL) ×3 IMPLANT
NDL INSUFF ACCESS 14 VERSASTEP (NEEDLE) ×3 IMPLANT
NEEDLE FILTER BLUNT 18X 1/2SAF (NEEDLE) ×2
NEEDLE FILTER BLUNT 18X1 1/2 (NEEDLE) ×1 IMPLANT
NS IRRIG 500ML POUR BTL (IV SOLUTION) ×3 IMPLANT
PACK LAP CHOLECYSTECTOMY (MISCELLANEOUS) ×3 IMPLANT
SCISSORS METZENBAUM CVD 33 (INSTRUMENTS) ×3 IMPLANT
SEAL FOR SCOPE WARMER C3101 (MISCELLANEOUS) ×3 IMPLANT
SLEEVE ENDOPATH XCEL 5M (ENDOMECHANICALS) ×3 IMPLANT
SPONGE VERSALON 4X4 4PLY (MISCELLANEOUS) ×3 IMPLANT
STRIP CLOSURE SKIN 1/2X4 (GAUZE/BANDAGES/DRESSINGS) ×2 IMPLANT
SUT CHROMIC 5 0 RB 1 27 (SUTURE) ×6 IMPLANT
SUT MAXON ABS #0 GS21 30IN (SUTURE) ×3 IMPLANT
SUT VIC AB 0 CT2 27 (SUTURE) ×3 IMPLANT
SWABSTK COMLB BENZOIN TINCTURE (MISCELLANEOUS) ×3 IMPLANT
SYR 3ML LL SCALE MARK (SYRINGE) ×3 IMPLANT
TROCAR XCEL NON-BLD 11X100MML (ENDOMECHANICALS) ×3 IMPLANT
TROCAR XCEL NON-BLD 5MMX100MML (ENDOMECHANICALS) ×3 IMPLANT
TUBING INSUFFLATOR HI FLOW (MISCELLANEOUS) ×3 IMPLANT
WATER STERILE IRR 1000ML POUR (IV SOLUTION) ×3 IMPLANT

## 2016-04-05 NOTE — Anesthesia Procedure Notes (Signed)
Procedure Name: Intubation Date/Time: 04/05/2016 7:41 AM Performed by: Justus Memory Pre-anesthesia Checklist: Patient identified, Emergency Drugs available, Suction available and Patient being monitored Patient Re-evaluated:Patient Re-evaluated prior to inductionOxygen Delivery Method: Circle system utilized Preoxygenation: Pre-oxygenation with 100% oxygen Intubation Type: IV induction Ventilation: Mask ventilation without difficulty Laryngoscope Size: Mac and 3 Grade View: Grade I Tube type: Oral Tube size: 7.0 mm Number of attempts: 1 Airway Equipment and Method: Patient positioned with wedge pillow and Stylet Placement Confirmation: ETT inserted through vocal cords under direct vision,  positive ETCO2,  CO2 detector and breath sounds checked- equal and bilateral Secured at: 21 cm Tube secured with: Tape Dental Injury: Teeth and Oropharynx as per pre-operative assessment

## 2016-04-05 NOTE — Discharge Instructions (Signed)
Take Tylenol or Norco if needed for pain.  Should not drive or do anything dangerous when taking Norco.  Remove dressings on Saturday. May shower Sunday.  Avoid straining and heavy lifting.

## 2016-04-05 NOTE — OR Nursing (Signed)
1245 Peppermint oil administered at 11:30 effective no vomiting and only mild residual nausea.  Dr. Tamala Julian ordered zofran for home use.

## 2016-04-05 NOTE — Transfer of Care (Signed)
Immediate Anesthesia Transfer of Care Note  Patient: Tammy Koch  Procedure(s) Performed: Procedure(s): LAPAROSCOPIC CHOLECYSTECTOMY WITH INTRAOPERATIVE CHOLANGIOGRAM (N/A)  Patient Location: PACU  Anesthesia Type:General  Level of Consciousness: sedated  Airway & Oxygen Therapy: Patient Spontanous Breathing and Patient connected to face mask oxygen  Post-op Assessment: Report given to RN and Post -op Vital signs reviewed and stable  Post vital signs: Reviewed and stable  Last Vitals:  Vitals:   04/05/16 0612  BP: (!) 142/69  Pulse: 76  Resp: 18  Temp: 37.2 C    Last Pain:  Vitals:   04/05/16 0612  TempSrc: Oral         Complications: No apparent anesthesia complications

## 2016-04-05 NOTE — Anesthesia Postprocedure Evaluation (Signed)
Anesthesia Post Note  Patient: Tammy Koch  Procedure(s) Performed: Procedure(s) (LRB): LAPAROSCOPIC CHOLECYSTECTOMY WITH INTRAOPERATIVE CHOLANGIOGRAM (N/A)  Patient location during evaluation: PACU Anesthesia Type: General Level of consciousness: awake and alert and oriented Pain management: pain level controlled Vital Signs Assessment: post-procedure vital signs reviewed and stable Respiratory status: spontaneous breathing Cardiovascular status: blood pressure returned to baseline Anesthetic complications: no    Last Vitals:  Vitals:   04/05/16 1035 04/05/16 1050  BP: 127/61 (!) 122/58  Pulse: (!) 56 (!) 57  Resp: 17 14  Temp: 36.4 C     Last Pain:  Vitals:   04/05/16 1050  TempSrc:   PainSc: 0-No pain                 Kadey Mihalic

## 2016-04-05 NOTE — Anesthesia Preprocedure Evaluation (Signed)
Anesthesia Evaluation  Patient identified by MRN, date of birth, ID band Patient awake    Reviewed: Allergy & Precautions, NPO status , Patient's Chart, lab work & pertinent test results  Airway Mallampati: II       Dental  (+) Caps   Pulmonary neg pulmonary ROS,    Pulmonary exam normal        Cardiovascular negative cardio ROS Normal cardiovascular exam     Neuro/Psych negative neurological ROS  negative psych ROS   GI/Hepatic Neg liver ROS, GERD  Medicated and Controlled,  Endo/Other  negative endocrine ROS  Renal/GU negative Renal ROS  negative genitourinary   Musculoskeletal  (+) Arthritis , Osteoarthritis,    Abdominal Normal abdominal exam  (+)   Peds negative pediatric ROS (+)  Hematology negative hematology ROS (+)   Anesthesia Other Findings   Reproductive/Obstetrics                             Anesthesia Physical Anesthesia Plan  ASA: II  Anesthesia Plan: General   Post-op Pain Management:    Induction: Intravenous  Airway Management Planned: Oral ETT  Additional Equipment:   Intra-op Plan:   Post-operative Plan: Extubation in OR  Informed Consent: I have reviewed the patients History and Physical, chart, labs and discussed the procedure including the risks, benefits and alternatives for the proposed anesthesia with the patient or authorized representative who has indicated his/her understanding and acceptance.   Dental advisory given  Plan Discussed with: CRNA and Surgeon  Anesthesia Plan Comments:         Anesthesia Quick Evaluation

## 2016-04-05 NOTE — Op Note (Signed)
OPERATIVE REPORT  PREOPERATIVE DIAGNOSIS:  Chronic cholecystitis cholelithiasis  POSTOPERATIVE DIAGNOSIS: Chronic cholecystitis cholelithiasis  PROCEDURE: Laparoscopic cholecystectomy with cholangiogram  ANESTHESIA: General  SURGEON: Rochel Brome M.D.  INDICATIONS: She has history of epigastric pains and ultrasound findings of a large gallstone. Surgery was recommended for definitive treatment    With the patient on the operating table in the supine position under general endotracheal anesthesia the abdomen was prepared with ChloraPrep solution and draped in a sterile manner. A short incision was made in the inferior aspect of the umbilicus and carried down to the deep fascia which was grasped with a laryngeal hook. A Veress needle was inserted aspirated and irrigated with a saline solution. The peritoneal cavity was insufflated with carbon dioxide. The Veress needle was removed. The 10 mm cannula was inserted. The 10 mm 0 laparoscope was inserted to view the peritoneal cavity.  Another incision was made in the epigastrium slightly to the right of the midline to introduce an 11 mm cannula. 2 incisions were made in the lateral aspect of the right upper quadrant to introduce 2   5 mm cannulas. Initial inspection revealed a smooth surface of the liver.  The gallbladder was retracted towards the right shoulder.  The gallbladder neck was retracted inferiorly and laterally.  The porta hepatis was identified. The gallbladder was mobilized with incision of the visceral peritoneum. The cystic duct was dissected free from surrounding structures. The cystic artery was dissected free from surrounding structures. A critical view of safety was demonstrated  An Endo Clip was placed across the cystic duct adjacent to the gallbladder neck. An incision was made in the cystic duct to introduce a Reddick catheter. The cholangiogram was done with injection of half-strength Conray 60 dye. This demonstrated the bile  ducts and flow of dye into the duodenum. No retained stones were identified. The cholangiogram appeared normal. The Reddick catheter was removed. The cystic duct was doubly ligated with endoclips and divided. The cystic artery was controlled with double endoclips and divided. The gallbladder was dissected free from the liver with use of hook and cautery and blunt dissection. Bleeding was minimal and hemostasis was intact. The gallbladder was delivered up through the infraumbilical incision opened and suctioned.  Stone forceps were used to grasp a large stone which could not be crushed. It was necessary to lengthen the incision by approximately 1 cm and also lengthened the fascial incision. The gallbladder with a large stone was manipulated up and removed and submitted in formalin for routine pathology. The right upper quadrant was further inspected and could see hemostasis was intact. The cannulas were removed allowing carbon dioxide to escape from the peritoneal cavity. The fascia at the umbilicus was closed with interrupted 0 Maxon figure-of-eight sutures. The skin incisions were closed with interrupted 5-0 chromic subcuticular suture benzoin and Steri-Strips   Gauze dressings were applied with paper tape.  The patient appeared to be in satisfactory condition and was prepared for transfer to the recovery room  [place].D.

## 2016-04-05 NOTE — OR Nursing (Signed)
Nausea without vomiting.  Peppermint oil on cotton ball after safety assessment done.

## 2016-04-08 LAB — SURGICAL PATHOLOGY

## 2017-01-09 ENCOUNTER — Other Ambulatory Visit: Payer: Self-pay | Admitting: Obstetrics and Gynecology

## 2017-01-09 DIAGNOSIS — Z1231 Encounter for screening mammogram for malignant neoplasm of breast: Secondary | ICD-10-CM

## 2017-02-05 ENCOUNTER — Ambulatory Visit
Admission: RE | Admit: 2017-02-05 | Discharge: 2017-02-05 | Disposition: A | Discharge status: Home / Self Care | Payer: BLUE CROSS/BLUE SHIELD | Source: Ambulatory Visit | Attending: Obstetrics and Gynecology | Admitting: Obstetrics and Gynecology

## 2017-02-05 DIAGNOSIS — Z1231 Encounter for screening mammogram for malignant neoplasm of breast: Secondary | ICD-10-CM | POA: Insufficient documentation

## 2018-01-20 ENCOUNTER — Other Ambulatory Visit: Payer: Self-pay | Admitting: Obstetrics and Gynecology

## 2018-01-20 DIAGNOSIS — Z1231 Encounter for screening mammogram for malignant neoplasm of breast: Secondary | ICD-10-CM

## 2018-02-09 ENCOUNTER — Ambulatory Visit
Admission: RE | Admit: 2018-02-09 | Discharge: 2018-02-09 | Disposition: A | Discharge status: Home / Self Care | Payer: BLUE CROSS/BLUE SHIELD | Source: Ambulatory Visit | Attending: Obstetrics and Gynecology | Admitting: Obstetrics and Gynecology

## 2018-02-09 DIAGNOSIS — Z1231 Encounter for screening mammogram for malignant neoplasm of breast: Secondary | ICD-10-CM | POA: Insufficient documentation

## 2019-02-03 ENCOUNTER — Other Ambulatory Visit: Payer: Self-pay | Admitting: Obstetrics and Gynecology

## 2019-02-03 DIAGNOSIS — Z1231 Encounter for screening mammogram for malignant neoplasm of breast: Secondary | ICD-10-CM

## 2019-03-24 ENCOUNTER — Ambulatory Visit
Admission: RE | Admit: 2019-03-24 | Discharge: 2019-03-24 | Disposition: A | Discharge status: Home / Self Care | Payer: BC Managed Care – PPO | Source: Ambulatory Visit | Attending: Obstetrics and Gynecology | Admitting: Obstetrics and Gynecology

## 2019-03-24 DIAGNOSIS — Z1231 Encounter for screening mammogram for malignant neoplasm of breast: Secondary | ICD-10-CM

## 2019-04-12 ENCOUNTER — Ambulatory Visit: Payer: BC Managed Care – PPO | Admitting: Podiatry

## 2019-04-12 ENCOUNTER — Encounter: Payer: Self-pay | Admitting: Podiatry

## 2019-04-12 ENCOUNTER — Ambulatory Visit (INDEPENDENT_AMBULATORY_CARE_PROVIDER_SITE_OTHER): Payer: BC Managed Care – PPO

## 2019-04-12 ENCOUNTER — Other Ambulatory Visit: Payer: Self-pay

## 2019-04-12 DIAGNOSIS — N200 Calculus of kidney: Secondary | ICD-10-CM | POA: Insufficient documentation

## 2019-04-12 DIAGNOSIS — M2041 Other hammer toe(s) (acquired), right foot: Secondary | ICD-10-CM | POA: Diagnosis not present

## 2019-04-12 DIAGNOSIS — M722 Plantar fascial fibromatosis: Secondary | ICD-10-CM | POA: Insufficient documentation

## 2019-04-12 DIAGNOSIS — E669 Obesity, unspecified: Secondary | ICD-10-CM | POA: Insufficient documentation

## 2019-04-12 DIAGNOSIS — G5620 Lesion of ulnar nerve, unspecified upper limb: Secondary | ICD-10-CM | POA: Insufficient documentation

## 2019-04-12 DIAGNOSIS — M5136 Other intervertebral disc degeneration, lumbar region: Secondary | ICD-10-CM | POA: Insufficient documentation

## 2019-04-12 NOTE — Progress Notes (Signed)
Subjective:  Patient ID: Tammy Koch, female    DOB: 01-17-1961,  MRN: [id number] HPI Chief Complaint  Patient presents with  . Hammer Toe    Patient presents today for painful hammertoe 2nd right toe x years.  She reports it started getting painful in the past 3-4 months and the pain radiates into her foot.  She has been wearing corn pads and toe pads on her toe and taking Tylenol as needed for pain.  She wants to discuss treatment options today    58 y.o. female presents with the above complaint.   ROS: Denies fever chills nausea vomiting muscle aches pains calf pain back pain chest pain shortness of breath.  Past Medical History:  Diagnosis Date  . DDD (degenerative disc disease), cervical   . GERD (gastroesophageal reflux disease)   . History of kidney stones   . Muscle pain   . Night sweats    Past Surgical History:  Procedure Laterality Date  . ANKLE SURGERY  10.15.2011  . BACK SURGERY  7.12.2002  . CHOLECYSTECTOMY N/A 04/05/2016   Procedure: LAPAROSCOPIC CHOLECYSTECTOMY WITH INTRAOPERATIVE CHOLANGIOGRAM;  Surgeon: Leonie Green, MD;  Location: [facility] ORS;  Service: General;  Laterality: N/A;  . COLONOSCOPY    . ELBOW SURGERY Right 2007  . KIDNEY STONES  2006   LITHOTRIPSY    Current Outpatient Medications:  .  acetaminophen (TYLENOL) 500 MG tablet, Take 500 mg by mouth every 6 (six) hours as needed., Disp: , Rfl:   Allergies  Allergen Reactions  . Morphine And Related Nausea And Vomiting   Review of Systems Objective:  There were no vitals filed for this visit.  General: Well developed, nourished, in no acute distress, alert and oriented x3   Dermatological: Skin is warm, dry and supple bilateral. Nails x 10 are well maintained; remaining integument appears unremarkable at this time. There are no open sores, no preulcerative lesions, no rash or signs of infection present.  Vascular: Dorsalis Pedis artery and Posterior Tibial artery pedal pulses are 2/4  bilateral with immedate capillary fill time. Pedal hair growth present. No varicosities and no lower extremity edema present bilateral.   Neruologic: Grossly intact via light touch bilateral. Vibratory intact via tuning fork bilateral. Protective threshold with Semmes Wienstein monofilament intact to all pedal sites bilateral. Patellar and Achilles deep tendon reflexes 2+ bilateral. No Babinski or clonus noted bilateral.   Musculoskeletal: No gross boney pedal deformities bilateral. No pain, crepitus, or limitation noted with foot and ankle range of motion bilateral. Muscular strength 5/5 in all groups tested bilateral.  Severe hammertoe deformity with rigid PIPJ second digit of the right foot with medial deviation of the toe at the level of the metatarsophalangeal joint.  There is mild tenderness on palpation of the second metatarsophalangeal joint and the toe is not reducible to rectus position.  Gait: Unassisted, Nonantalgic.    Radiographs:  Radiographs of the right foot today demonstrate a rectus foot.  An elongated second metatarsal demonstrates a hammertoe deformity with dorsal deviation and medial deviation.  Lateral view does demonstrate severe contracture deformity of the second toe right foot at approximately 45 degree angle on dorsiflexion.  Assessment & Plan:   Assessment: Severe hammertoe deformity second right dislocation syndrome with capsulitis right.  Plan: Discussed etiology pathology conservative surgical therapies at this point time discussed in great detail surgical intervention.  She would like to have this done in the near future she will follow-up with me for surgery consult.  Garrel Ridgel, DPM

## 2021-01-03 ENCOUNTER — Other Ambulatory Visit: Payer: Self-pay | Admitting: Obstetrics and Gynecology

## 2021-01-03 DIAGNOSIS — Z1231 Encounter for screening mammogram for malignant neoplasm of breast: Secondary | ICD-10-CM

## 2021-01-08 ENCOUNTER — Ambulatory Visit
Admission: RE | Admit: 2021-01-08 | Discharge: 2021-01-08 | Disposition: A | Discharge status: Home / Self Care | Payer: BC Managed Care – PPO | Source: Ambulatory Visit | Attending: Obstetrics and Gynecology | Admitting: Obstetrics and Gynecology

## 2021-01-08 ENCOUNTER — Other Ambulatory Visit: Payer: Self-pay

## 2021-01-08 DIAGNOSIS — Z1231 Encounter for screening mammogram for malignant neoplasm of breast: Secondary | ICD-10-CM | POA: Insufficient documentation

## 2021-02-26 ENCOUNTER — Other Ambulatory Visit: Payer: Self-pay

## 2021-02-26 ENCOUNTER — Other Ambulatory Visit: Payer: Self-pay | Admitting: Podiatry

## 2021-02-26 ENCOUNTER — Ambulatory Visit (INDEPENDENT_AMBULATORY_CARE_PROVIDER_SITE_OTHER): Payer: BC Managed Care – PPO

## 2021-02-26 ENCOUNTER — Ambulatory Visit: Payer: BC Managed Care – PPO | Admitting: Podiatry

## 2021-02-26 ENCOUNTER — Encounter: Payer: Self-pay | Admitting: Podiatry

## 2021-02-26 DIAGNOSIS — M778 Other enthesopathies, not elsewhere classified: Secondary | ICD-10-CM | POA: Diagnosis not present

## 2021-02-26 DIAGNOSIS — M76821 Posterior tibial tendinitis, right leg: Secondary | ICD-10-CM | POA: Diagnosis not present

## 2021-02-26 DIAGNOSIS — M7752 Other enthesopathy of left foot: Secondary | ICD-10-CM | POA: Diagnosis not present

## 2021-02-26 DIAGNOSIS — M722 Plantar fascial fibromatosis: Secondary | ICD-10-CM

## 2021-02-26 DIAGNOSIS — D2372 Other benign neoplasm of skin of left lower limb, including hip: Secondary | ICD-10-CM | POA: Diagnosis not present

## 2021-02-26 DIAGNOSIS — M2041 Other hammer toe(s) (acquired), right foot: Secondary | ICD-10-CM

## 2021-02-26 DIAGNOSIS — M2042 Other hammer toe(s) (acquired), left foot: Secondary | ICD-10-CM

## 2021-02-26 MED ORDER — TRIAMCINOLONE ACETONIDE 40 MG/ML IJ SUSP
20.0000 mg | Freq: Once | INTRAMUSCULAR | Status: AC
  Administered 2021-02-26: 20 mg

## 2021-02-26 MED ORDER — METHYLPREDNISOLONE 4 MG PO TBPK: ORAL_TABLET | ORAL | 0 refills | Status: DC

## 2021-02-26 MED ORDER — DEXAMETHASONE SODIUM PHOSPHATE 120 MG/30ML IJ SOLN
2.0000 mg | Freq: Once | INTRAMUSCULAR | Status: AC
  Administered 2021-02-26: 2 mg via INTRA_ARTICULAR

## 2021-02-26 MED ORDER — MELOXICAM 15 MG PO TABS: 15.0000 mg | ORAL_TABLET | Freq: Every day | ORAL | 3 refills | Status: AC

## 2021-02-26 NOTE — Progress Notes (Signed)
She presents today chief concern of her medial arch of her right foot and a painful lesion second toe plantarly at the DIPJ.  Objective: Vital signs are stable alert and oriented x3.  She does have pain on palpation medial calcaneal tubercle of the right heel and she also has tenderness on palpation with inversion against resistance.  Tenderness is also noted on palpation of the inferior aspect of the navicular bone right.  Radiographs do not demonstrate any type of osseous abnormalities only soft tissue increase in density along the medial ankle and the medial calcaneal tubercle.  Left foot does demonstrate hammertoe deformity or swan-neck deformity of the second toe resulting in a plantar benign lesion.  Assessment: Hammertoe deformity with medial deviation second bilateral.  Planter fasciitis right with compensatory posterior tibial tendinitis.  And benign skin lesion plantar aspect of the second toe left foot.  Plan: Discussed etiology pathology conservative surgical therapies this point time debrided reactive hyperkeratotic lesion.  Placed her in a L 1902 her plantar fascial brace I injected the plantar fascia 20 mg Kenalog 5 mg Marcaine I also injected the point of maximal tenderness of the posterior tibial tendon area at the navicular making sure not to inject into the tendon itself.  I injected 2 mg of dexamethasone here.  Discussed appropriate shoe gear stretching exercises ice therapy follow-up with her in the near future.

## 2021-03-27 IMAGING — MG DIGITAL SCREENING BILAT W/ TOMO W/ CAD
6 of 10 series · 6 of 30 positions shown · non-contrast
Comparison: Previous exam(s).

CLINICAL DATA: Screening.

EXAM:
DIGITAL SCREENING BILATERAL MAMMOGRAM WITH TOMO AND CAD

[L MLO synth-2D (1 of 2)]
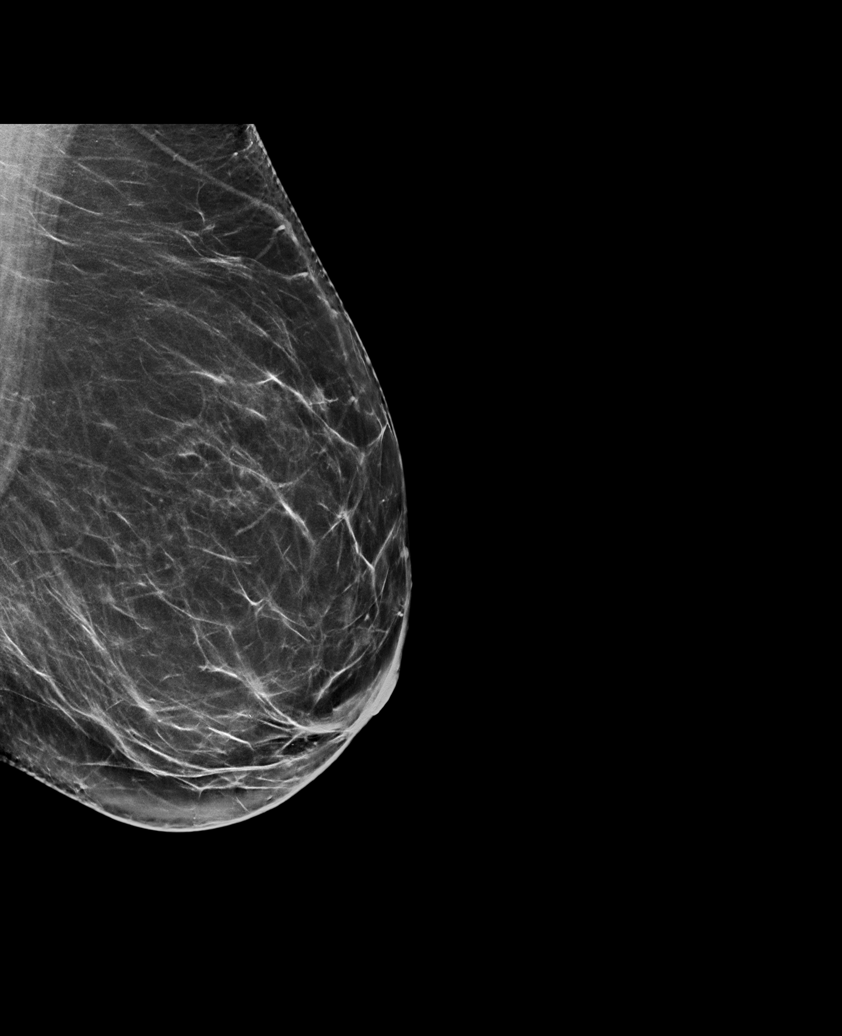

[R MLO synth-2D]
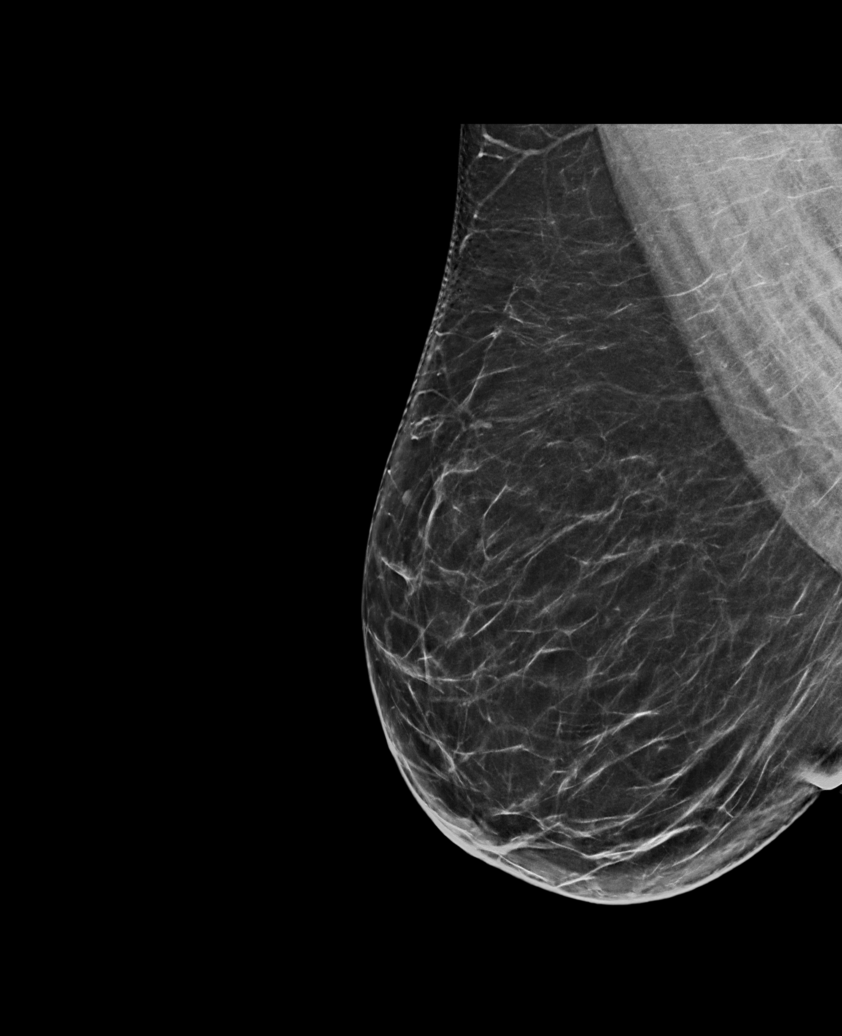

[R CC synth-2D]
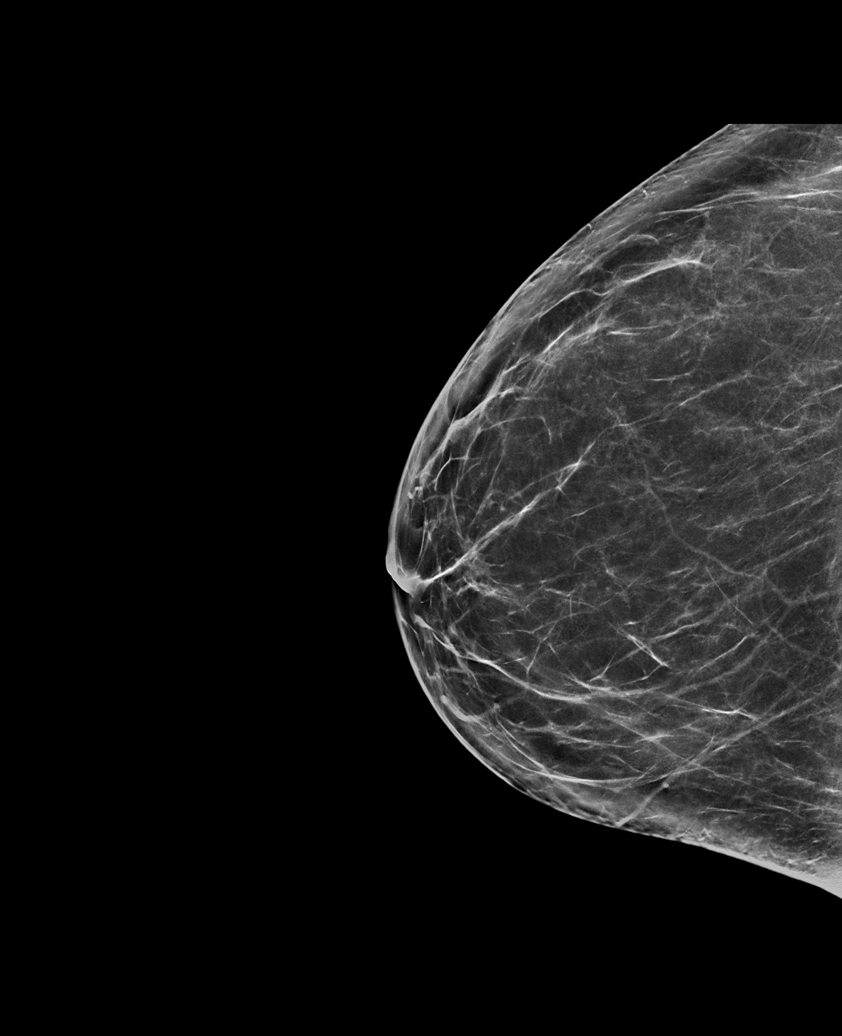

[L MLO synth-2D (2 of 2)]
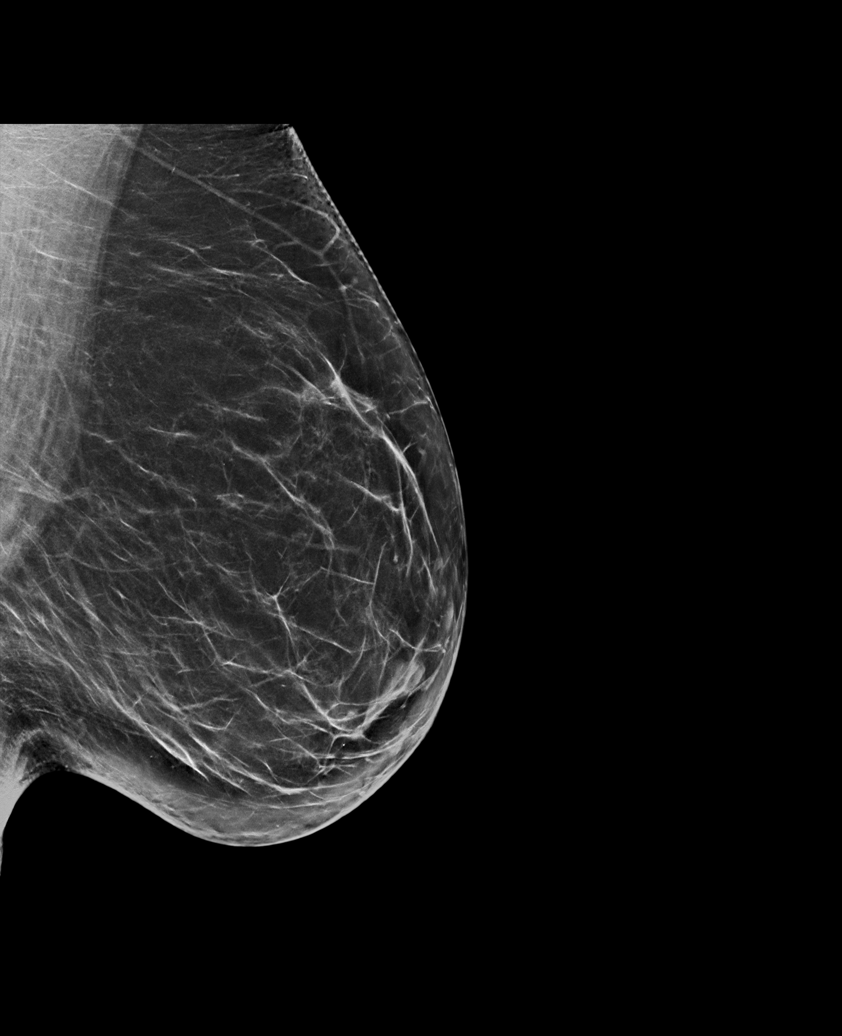

[L CC synth-2D]
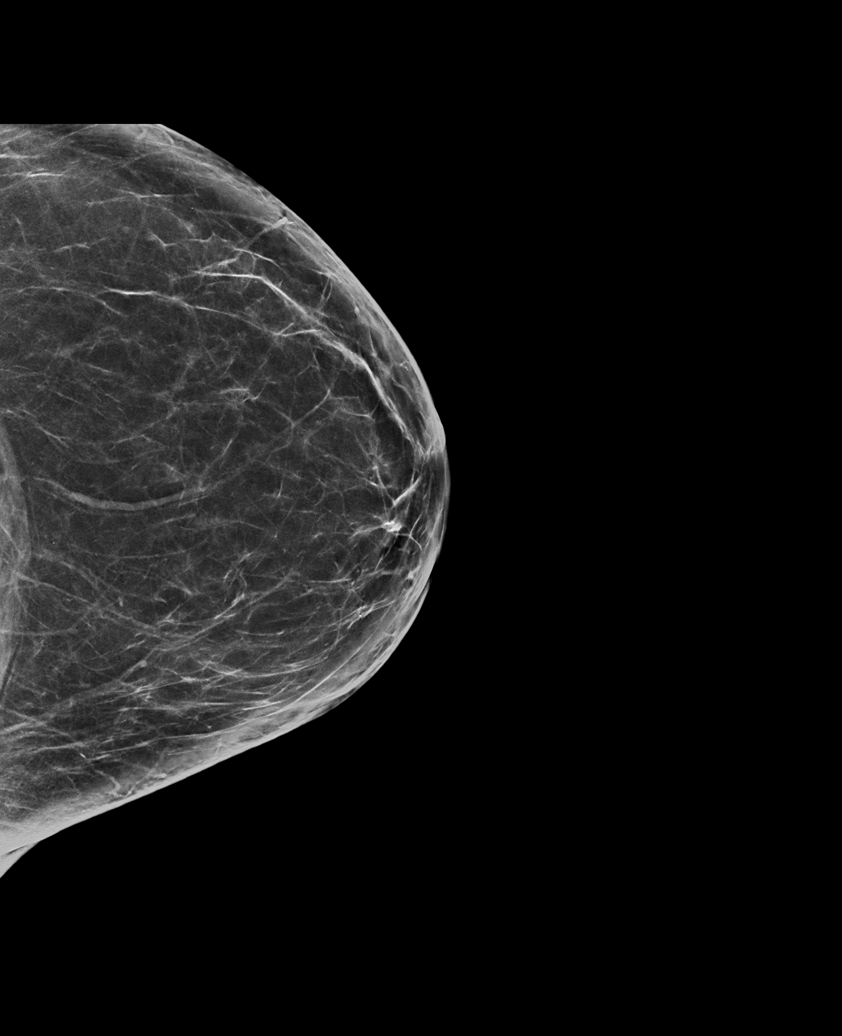

[L MLO tomo · tomo slice 41/82.0]
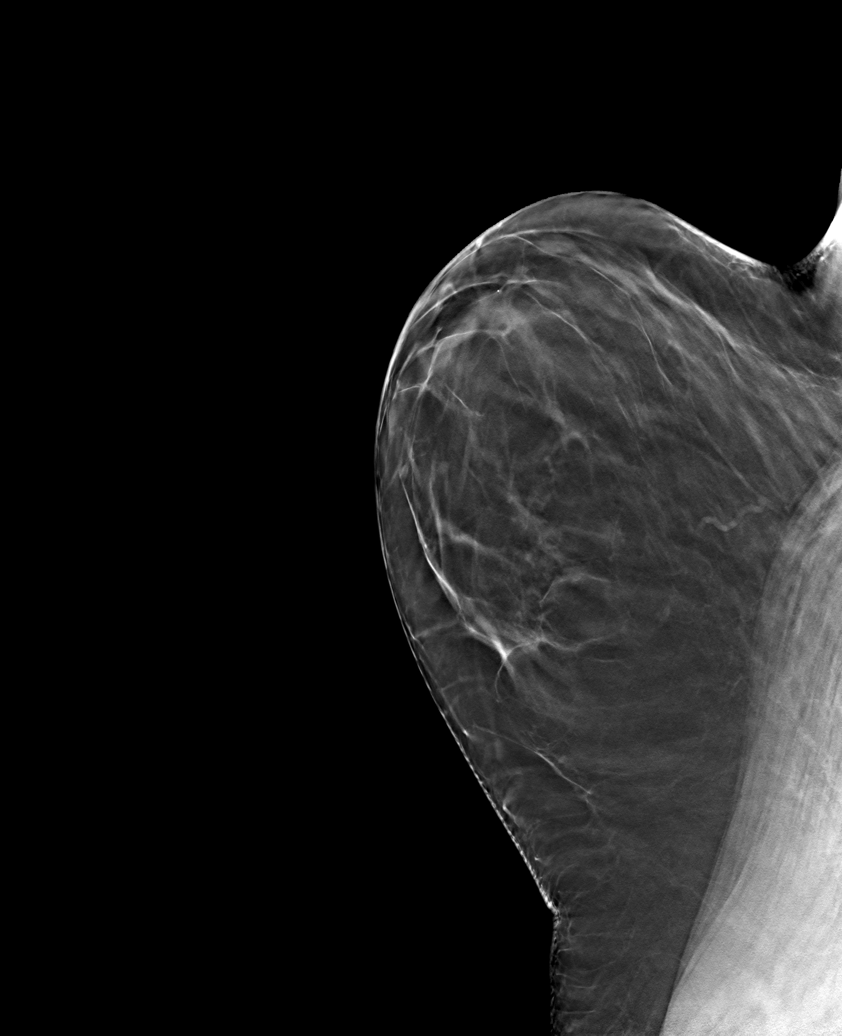

[6 of 30 positions shown; findings below may reference images not displayed]

ACR Breast Density Category b: There are scattered areas of
fibroglandular density.
FINDINGS: There are no findings suspicious for malignancy. Images were
processed with CAD.
IMPRESSION: No mammographic evidence of malignancy. A result letter of this
screening mammogram will be mailed directly to the patient.

RECOMMENDATION:
Screening mammogram in one year. (Code:[id number])

BI-RADS CATEGORY  1: Negative.

## 2021-04-09 ENCOUNTER — Encounter: Payer: BC Managed Care – PPO | Admitting: Podiatry

## 2021-11-19 ENCOUNTER — Ambulatory Visit: Payer: BC Managed Care – PPO | Admitting: Podiatry

## 2021-11-19 ENCOUNTER — Encounter: Payer: Self-pay | Admitting: Podiatry

## 2021-11-19 DIAGNOSIS — D2372 Other benign neoplasm of skin of left lower limb, including hip: Secondary | ICD-10-CM

## 2021-11-19 NOTE — Progress Notes (Signed)
She presents today for chief concern of callused area to the plantar aspect of her second toe of her left foot.  She states that it was blistered a few weeks ago now is just a callused area that is quite tender.  She has underwent back surgery which has left her left foot with foot drop.  Objective: Vital signs are stable alert oriented x3.  Pulses are palpable.  She does have some weakness on dorsiflexion but plantarflexion is very strong.  She has a callused area just beneath the DIPJ of the second digit of the left foot most likely secondary to not only along toe but flexor stabilization that is resulting from stronger plantar flexors and dorsiflexors.  Assessment: Callused area subsecond toe left.  Plan: Debrided the area for her today and placed padding we will follow-up with her on an as-needed basis.

## 2022-02-20 ENCOUNTER — Other Ambulatory Visit: Payer: Self-pay | Admitting: Obstetrics and Gynecology

## 2022-02-20 ENCOUNTER — Other Ambulatory Visit: Payer: Self-pay | Admitting: Internal Medicine

## 2022-02-20 DIAGNOSIS — Z1231 Encounter for screening mammogram for malignant neoplasm of breast: Secondary | ICD-10-CM

## 2022-04-09 ENCOUNTER — Ambulatory Visit
Admission: RE | Admit: 2022-04-09 | Discharge: 2022-04-09 | Disposition: A | Discharge status: Home / Self Care | Payer: BC Managed Care – PPO | Source: Ambulatory Visit | Attending: Obstetrics and Gynecology | Admitting: Obstetrics and Gynecology

## 2022-04-09 DIAGNOSIS — Z1231 Encounter for screening mammogram for malignant neoplasm of breast: Secondary | ICD-10-CM | POA: Diagnosis present

## 2022-11-18 ENCOUNTER — Ambulatory Visit: Payer: BC Managed Care – PPO | Admitting: Podiatry

## 2022-11-18 DIAGNOSIS — D2372 Other benign neoplasm of skin of left lower limb, including hip: Secondary | ICD-10-CM

## 2022-11-18 DIAGNOSIS — D2371 Other benign neoplasm of skin of right lower limb, including hip: Secondary | ICD-10-CM | POA: Diagnosis not present

## 2022-11-18 DIAGNOSIS — M7751 Other enthesopathy of right foot: Secondary | ICD-10-CM

## 2022-11-18 MED ORDER — DEXAMETHASONE SODIUM PHOSPHATE 120 MG/30ML IJ SOLN
2.0000 mg | Freq: Once | INTRAMUSCULAR | Status: AC
Start: 2022-11-18 — End: 2022-11-18
  Administered 2022-11-18: 2 mg via INTRA_ARTICULAR

## 2022-11-18 NOTE — Progress Notes (Signed)
She presents today after having not seen her for a year with a chief complaint of a painful area to the fifth metatarsal phalangeal joint area as she points to a reactive hyperkeratotic lesion to the plantar aspect of the fifth met.  She also has an area beneath the second toe at the level of the DIPJ on the left foot.  Objective: Vital signs are stable alert and oriented x 3.  Pulses are palpable.  She has a bursa that is noted beneath the fifth metatarsal head of the right foot and overlying benign skin lesion in that same area and then an overlying benign skin lesion on the plantar aspect of the DIPJ.  Assessment: DIPJ benign skin lesion and subfifth benign skin lesion right bursitis fifth right.  Plan: Debrided benign skin lesions and injected the area today with dexamethasone local anesthetic a total of 3 mg was utilized.  Follow-up with her on an as-needed basis.

## 2023-03-25 ENCOUNTER — Other Ambulatory Visit: Payer: Self-pay | Admitting: Obstetrics and Gynecology

## 2023-03-25 DIAGNOSIS — Z1231 Encounter for screening mammogram for malignant neoplasm of breast: Secondary | ICD-10-CM

## 2023-04-14 ENCOUNTER — Ambulatory Visit
Admission: RE | Admit: 2023-04-14 | Discharge: 2023-04-14 | Disposition: A | Discharge status: Home / Self Care | Payer: BC Managed Care – PPO | Source: Ambulatory Visit | Attending: Obstetrics and Gynecology | Admitting: Obstetrics and Gynecology

## 2023-04-14 DIAGNOSIS — Z1231 Encounter for screening mammogram for malignant neoplasm of breast: Secondary | ICD-10-CM | POA: Insufficient documentation

## 2024-03-08 ENCOUNTER — Ambulatory Visit: Admitting: Podiatrist

## 2024-03-08 ENCOUNTER — Ambulatory Visit

## 2024-03-08 ENCOUNTER — Encounter: Payer: Self-pay | Admitting: Podiatrist

## 2024-03-08 DIAGNOSIS — D2371 Other benign neoplasm of skin of right lower limb, including hip: Secondary | ICD-10-CM | POA: Diagnosis not present

## 2024-03-08 DIAGNOSIS — D2372 Other benign neoplasm of skin of left lower limb, including hip: Secondary | ICD-10-CM | POA: Diagnosis not present

## 2024-03-08 DIAGNOSIS — M2042 Other hammer toe(s) (acquired), left foot: Secondary | ICD-10-CM | POA: Diagnosis not present

## 2024-03-08 NOTE — Progress Notes (Signed)
 Chief Complaint  Patient presents with   Hammer Toe    hammertoe on left foot -painful callus lesions bilateral     HPI: Patient is 63 y.o. female who presents today for painful lesions x 1 left and right foot.  She has them trimmed about once a year and states this helps keep he feet comfortable.    Allergies  Allergen Reactions   Morphine And Codeine Nausea And Vomiting    Review of systems is negative except as noted in the HPI.  Denies nausea/ vomiting/ fevers/ chills or night sweats.   Denies difficulty breathing, denies calf pain or tenderness  Physical Exam  Patient is awake, alert, and oriented x 3.  In no acute distress.    Vascular status is intact with palpable pedal pulses DP and PT bilateral and capillary refill time less than 3 seconds bilateral.  No edema or erythema noted.   Neurological exam reveals epicritic and protective sensation grossly intact bilateral.   Dermatological exam reveals skin is supple and dry to bilateral feet.  Porokeratotic lesion submet 5 right foot is noted.  Porokeatotic lesion sub second toe left foot also seen.  Intact integument noted post debridement.    Musculoskeletal exam: hammertoe bilateral feet noted.  Plantar flexed metatarsals bilateal noted.     Assessment:   ICD-10-CM   1. Hammer toe of left foot  M20.42 DG Foot Complete Left    2. Benign neoplasm of skin of left foot  D23.72     3. Benign neoplasm of skin of right foot  D23.71         Plan: Lesions x 2 were debrided with a sterile blade and curette today without complication.  She will call in the future for further treatment if necessary.

## 2024-03-15 ENCOUNTER — Other Ambulatory Visit: Payer: Self-pay | Admitting: Obstetrics and Gynecology

## 2024-03-15 DIAGNOSIS — Z1231 Encounter for screening mammogram for malignant neoplasm of breast: Secondary | ICD-10-CM

## 2024-04-14 ENCOUNTER — Encounter

## 2024-06-08 ENCOUNTER — Ambulatory Visit
Admission: RE | Admit: 2024-06-08 | Discharge: 2024-06-08 | Disposition: A | Discharge status: Home / Self Care | Source: Ambulatory Visit | Attending: Obstetrics and Gynecology | Admitting: Obstetrics and Gynecology

## 2024-06-08 DIAGNOSIS — Z1231 Encounter for screening mammogram for malignant neoplasm of breast: Secondary | ICD-10-CM | POA: Insufficient documentation
